# Patient Record
Sex: Female | Born: 2000 | Race: White | Hispanic: No | Marital: Single | Smoking: Never smoker
Health system: Southern US, Community
[De-identification: ages and names within clinical notes are randomized; demographics above are authoritative.]

## PROBLEM LIST (undated history)

## (undated) DIAGNOSIS — R569 Unspecified convulsions: Secondary | ICD-10-CM

## (undated) HISTORY — PX: EYE SURGERY: SHX253

---
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
## 2019-08-26 ENCOUNTER — Encounter: Payer: Self-pay | Admitting: Emergency Medicine

## 2019-08-26 ENCOUNTER — Other Ambulatory Visit: Payer: Self-pay

## 2019-08-26 ENCOUNTER — Ambulatory Visit
Admission: EM | Admit: 2019-08-26 | Discharge: 2019-08-26 | Disposition: A | Discharge status: Home / Self Care | Payer: Medicaid Other | Attending: Physician Assistant | Admitting: Physician Assistant

## 2019-08-26 DIAGNOSIS — N926 Irregular menstruation, unspecified: Secondary | ICD-10-CM | POA: Diagnosis present

## 2019-08-26 DIAGNOSIS — S00412A Abrasion of left ear, initial encounter: Secondary | ICD-10-CM | POA: Insufficient documentation

## 2019-08-26 LAB — PREGNANCY, URINE: Preg Test, Ur: NEGATIVE

## 2019-08-26 NOTE — ED Triage Notes (Signed)
Pt states she has a tick in her left ear. She states her mother looked in her ear and saw what looks like a tick. She started feeling it last night. She states it feels like something is crawling in her ear and itches.

## 2019-08-26 NOTE — ED Provider Notes (Signed)
[facility]    CSN: [id number] Arrival date & time: 08/26/19  1403      History   Chief Complaint Chief Complaint  Patient presents with  . Foreign Body in Ear    left    HPI Sara Mcguire is a 19 y.o. female. Patient presents for concerns about tick in the left ear. She says her mother saw a tick outside the ear and then she watched it go into the ear. Patient says it feels like it is still there. She also noticed a small sore on the ear that she believes is from the tick. She denies fever, fatigue, aches, ear pain or discharge.  Patient also mentions that says she has not had a period in 3-4 months and does not know why. She would like to find out why. Has not been sexually active in 6 months or more and is not pregnant to her knowledge but has not taken a test. Patient has diagnosis of epilepsy but no other medical conditions. Denies abdominal pain, cramping, dysuria, vaginal discharge. No other concerns today.  HPI  Past Medical History:  Diagnosis Date  . Seizures (HCC)     There are no problems to display for this patient.   Past Surgical History:  Procedure Laterality Date  . EYE SURGERY Left     OB History   No obstetric history on file.      Home Medications    Prior to Admission medications   Medication Sig Start Date End Date Taking? Authorizing Provider  ferrous sulfate 325 (65 FE) MG tablet Take 325 mg by mouth daily with breakfast.   Yes [provider]  levothyroxine (SYNTHROID, LEVOTHROID) 75 MCG tablet Take 75 mcg by mouth daily before breakfast.   Yes [provider]  amphetamine-dextroamphetamine (ADDERALL) 5 MG tablet Take 5 mg by mouth daily.    [provider]  azithromycin (ZITHROMAX Z-PAK) 250 MG tablet Take 2 tablets first day and then 1 po a day for 4 days 02/10/16   Hassan Rowan, MD  benzonatate (TESSALON) 100 MG capsule Take 1 capsule (100 mg total) by mouth every 8 (eight) hours. 02/10/16   Hassan Rowan, MD  ibuprofen (ADVIL) 100 MG tablet Take 1 tablet (100 mg total) by mouth every 6 (six) hours as needed for fever. 12/20/15   Duanne Limerick, MD  lisdexamfetamine (VYVANSE) 60 MG capsule Take 60 mg by mouth every morning.    [provider]    Family History Family History  Problem Relation Age of Onset  . Healthy Mother   . Healthy Father     Social History Social History   Tobacco Use  . Smoking status: Never Smoker  . Smokeless tobacco: Never Used  Vaping Use  . Vaping Use: Never used  Substance Use Topics  . Alcohol use: No  . Drug use: No     Allergies   Clonidine derivatives and Penicillins   Review of Systems Review of Systems  Constitutional: Negative for fever.  HENT: Negative for ear pain.   Gastrointestinal: Negative for abdominal pain, nausea and vomiting.  Genitourinary: Positive for menstrual problem. Negative for difficulty urinating, genital sores, pelvic pain and vaginal discharge.  Musculoskeletal: Negative for arthralgias and myalgias.  Skin: Positive for wound. Negative for rash.  Neurological: Negative for weakness and headaches.     Physical Exam Triage Vital Signs ED Triage Vitals  Enc Vitals Group     BP 08/26/19 1502 136/84  Pulse Rate 08/26/19 1502 93     Resp 08/26/19 1502 18     Temp 08/26/19 1502 98.1 F (36.7 C)     Temp Source 08/26/19 1502 Oral     SpO2 08/26/19 1502 100 %     Weight 08/26/19 1457 179 lb 14.3 oz (81.6 kg)     Height 08/26/19 1457 5\' 1"  (1.549 m)     Head Circumference --      Peak Flow --      Pain Score 08/26/19 1457 0     Pain Loc --      Pain Edu? --      Excl. in GC? --    No data found.  Updated Vital Signs BP 136/84 (BP Location: Right Arm)   Pulse 93   Temp 98.1 F (36.7 C) (Oral)   Resp 18   Ht 5\' 1"  (1.549 m)   Wt 179 lb 14.3 oz (81.6 kg)   LMP 02/26/2019   SpO2 100%   BMI 33.99 kg/m     Physical Exam Vitals and nursing note reviewed.  Constitutional:       General: She is not in acute distress.    Appearance: Normal appearance. She is not ill-appearing or toxic-appearing.  HENT:     Head: Normocephalic and atraumatic.     Right Ear: Tympanic membrane and ear canal normal.     Left Ear: Tympanic membrane, ear canal and external ear normal.     Ears:     Comments: There is a superficial abrasion of the external ear. EAC clear with no evidence of a tick Eyes:     General: No scleral icterus.       Right eye: No discharge.        Left eye: No discharge.     Conjunctiva/sclera: Conjunctivae normal.  Cardiovascular:     Rate and Rhythm: Normal rate and regular rhythm.     Heart sounds: Normal heart sounds.  Pulmonary:     Effort: Pulmonary effort is normal. No respiratory distress.     Breath sounds: Normal breath sounds.  Abdominal:     Palpations: Abdomen is soft.     Tenderness: There is no abdominal tenderness.  Musculoskeletal:     Cervical back: Neck supple.  Skin:    General: Skin is dry.  Neurological:     General: No focal deficit present.     Mental Status: She is alert. Mental status is at baseline.     Motor: No weakness.     Gait: Gait normal.  Psychiatric:        Mood and Affect: Mood normal.        Behavior: Behavior normal.        Thought Content: Thought content normal.      UC Treatments / Results  Labs (all labs ordered are listed, but only abnormal results are displayed) Labs Reviewed  PREGNANCY, URINE    EKG   Radiology No results found.  Procedures Procedures (including critical care time)  Medications Ordered in UC Medications - No data to display  Initial Impression / Assessment and Plan / UC Course  I have reviewed the triage vital signs and the nursing notes.  Pertinent labs & imaging results that were available during my care of the patient were reviewed by me and considered in my medical decision making (see chart for details).    Final Clinical Impressions(s) / UC Diagnoses    Final diagnoses:  Irregular menses  Abrasion  of left ear, initial encounter     Discharge Instructions     -There was no tick in your ear today. You have a small scratch so you should keep area clean and apply Neosporin -Pregnancy test is negative today. Check with insurance carrier and make appointment with gynecology for work up about irregular menses--or contact a primary care office  [facility] at [facility], [address], [address]. Phone number: [phone] Please call this number to establish with primary care     ED Prescriptions    None     PDMP not reviewed this encounter.   Shirlee Latch, PA-C 08/27/19 [phone]

## 2019-08-26 NOTE — Discharge Instructions (Addendum)
-  There was no tick in your ear today. You have a small scratch so you should keep area clean and apply Neosporin -Pregnancy test is negative today. Check with insurance carrier and make appointment with gynecology for work up about irregular menses--or contact a primary care office  [facility] at [facility], [address], [address]. Phone number: [phone] Please call this number to establish with primary care
